# Patient Record
Sex: Male | Born: 1985 | Race: Black or African American | Hispanic: No | Marital: Single | Smoking: Current some day smoker
Health system: Southern US, Community
[De-identification: ages and names within clinical notes are randomized; demographics above are authoritative.]

---
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
## 2007-06-08 ENCOUNTER — Emergency Department (HOSPITAL_COMMUNITY): Admission: EM | Admit: 2007-06-08 | Discharge: 2007-06-08 | Payer: Self-pay | Admitting: Emergency Medicine

## 2008-06-05 IMAGING — CT CT HEAD W/O CM
1 series · 16 of 28 positions shown, 20 images · IV contrast (agent unspecified)
Comparison: None.

CLINICAL DATA: Right-sided headaches.
 HEAD CT WITHOUT CONTRAST:
TECHNIQUE: Contiguous axial images were obtained from the base of the skull through the vertex according to standard protocol without contrast.

[Series 2: brain · axial · 0.47mm/px · z∈[+140,+268]mm · 16 of 28 slices shown, 20 images]
[im 2/28  brain]
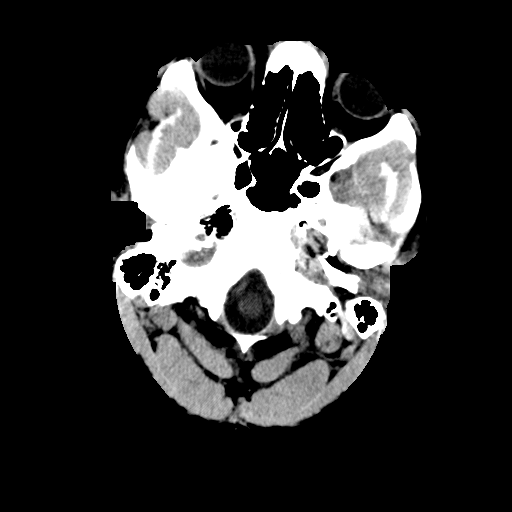
[im 2/28  bone]
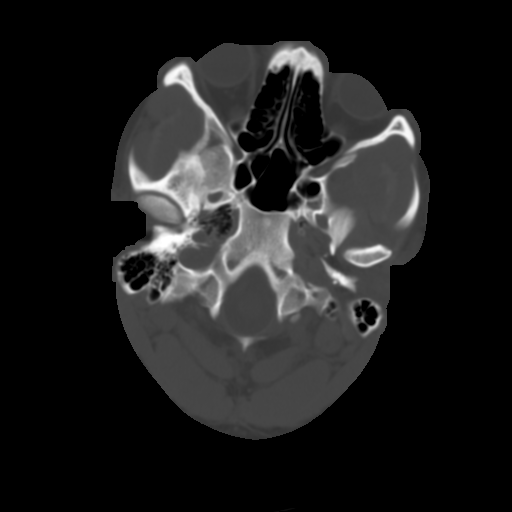
[im 4/28  brain]
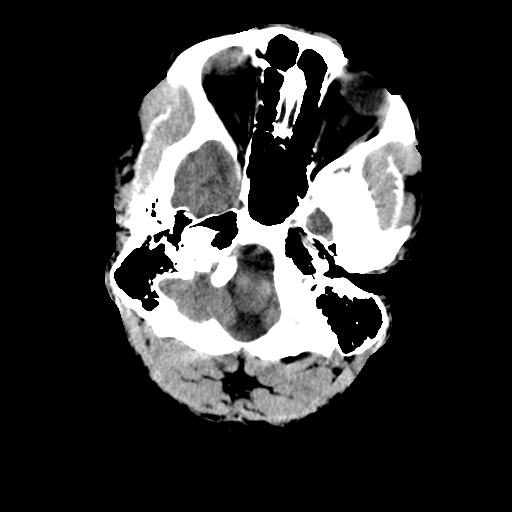
[im 6/28  brain]
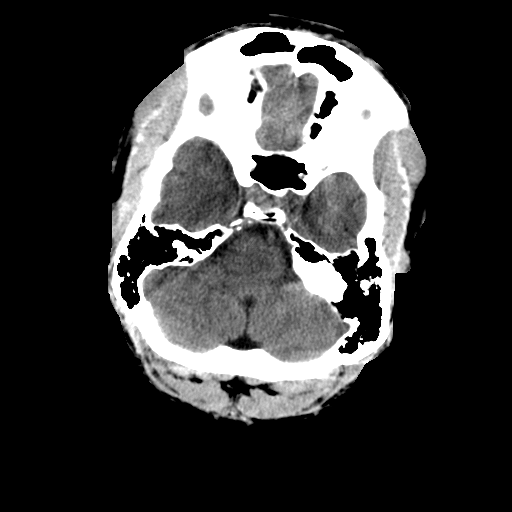
[im 7/28  brain]
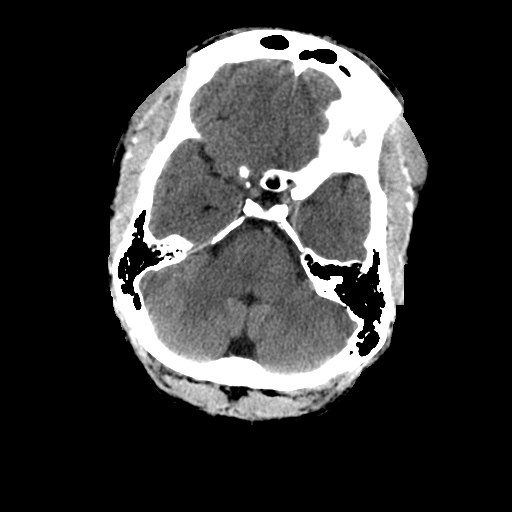
[im 9/28  brain]
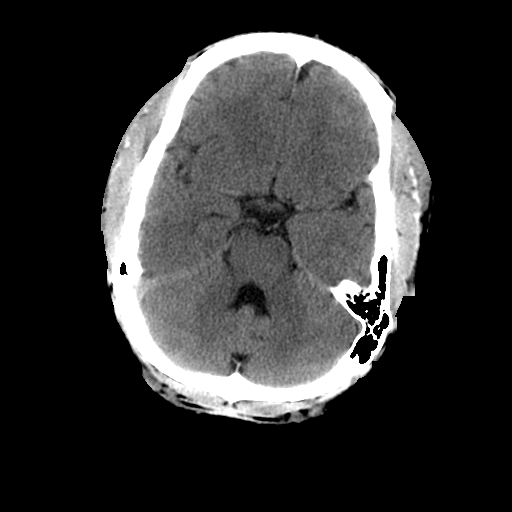
[im 9/28  bone]
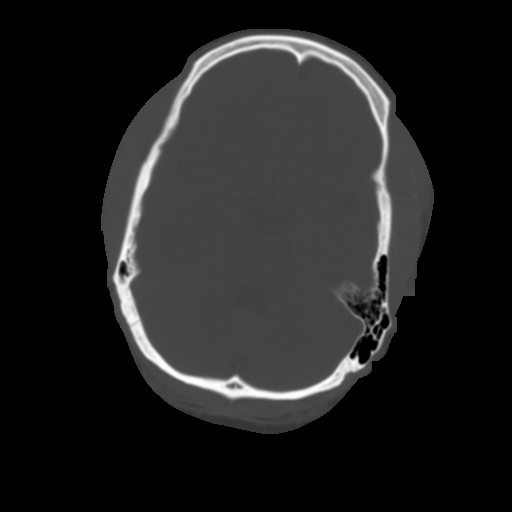
[im 10/28  brain]
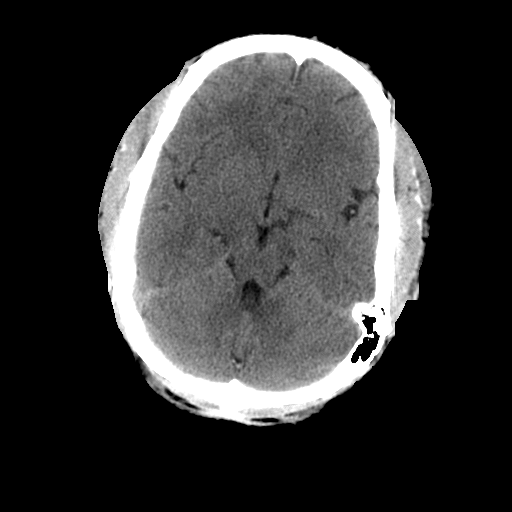
[im 12/28  brain]
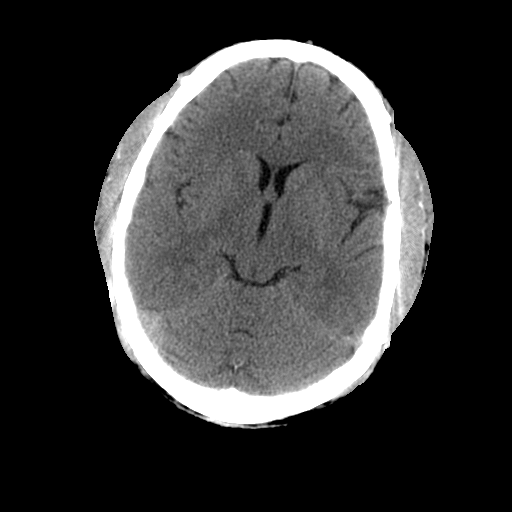
[im 14/28  brain]
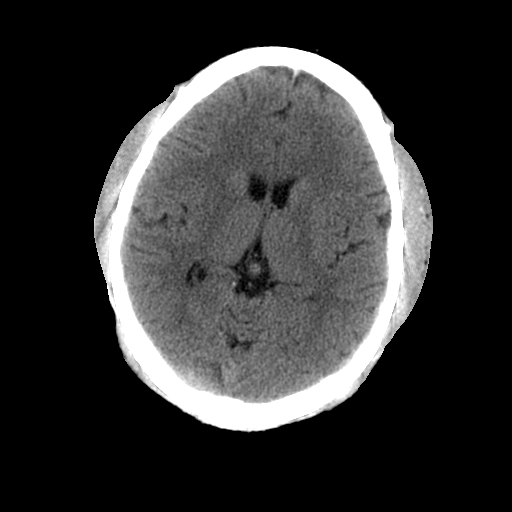
[im 15/28  brain]
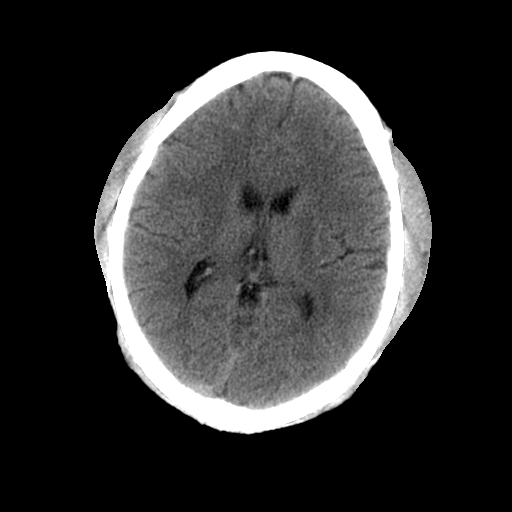
[im 15/28  bone]
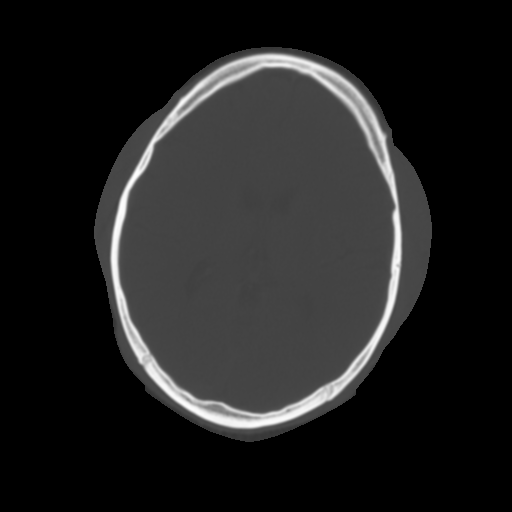
[im 17/28  brain]
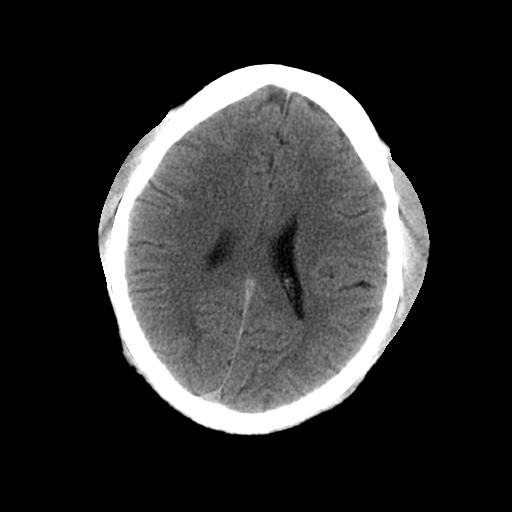
[im 19/28  brain]
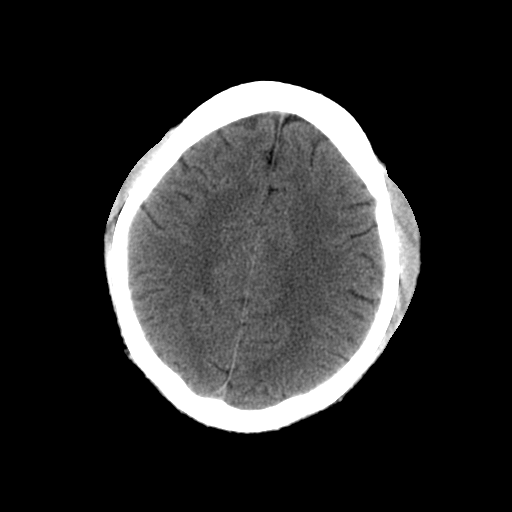
[im 20/28  brain]
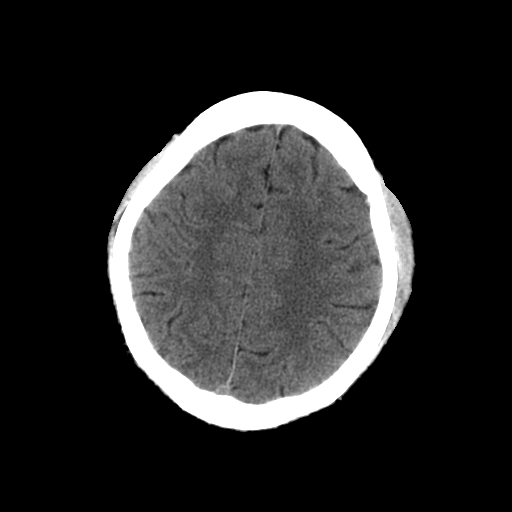
[im 22/28  brain]
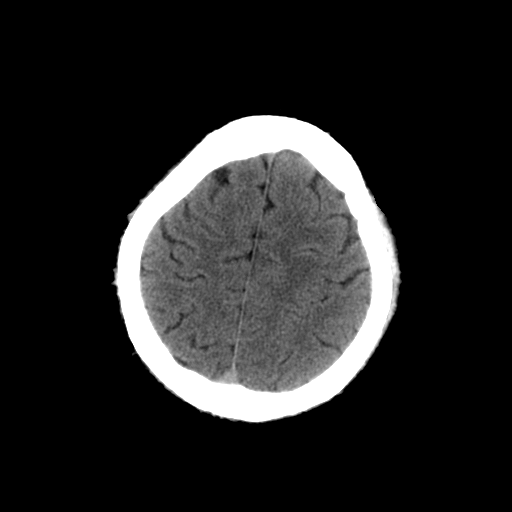
[im 22/28  bone]
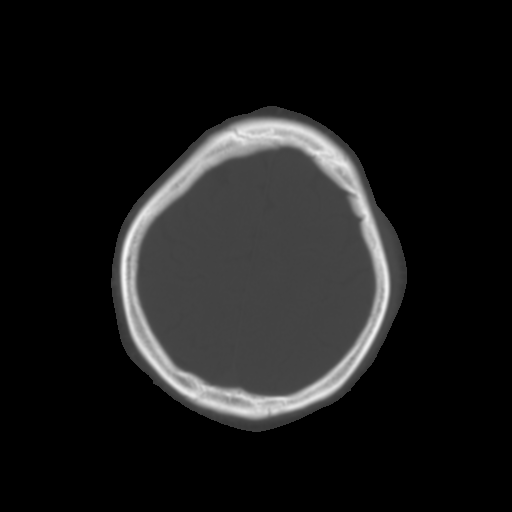
[im 23/28  brain]
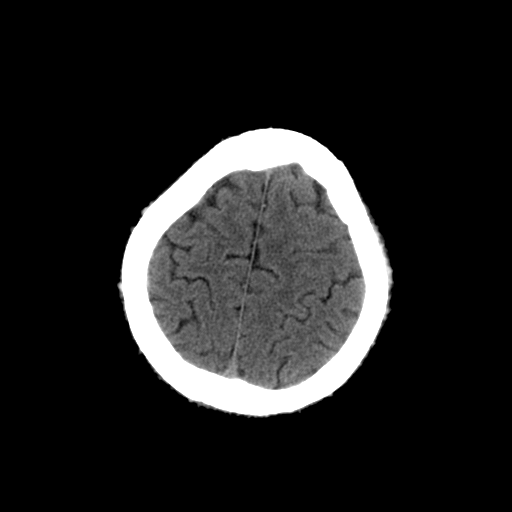
[im 25/28  brain]
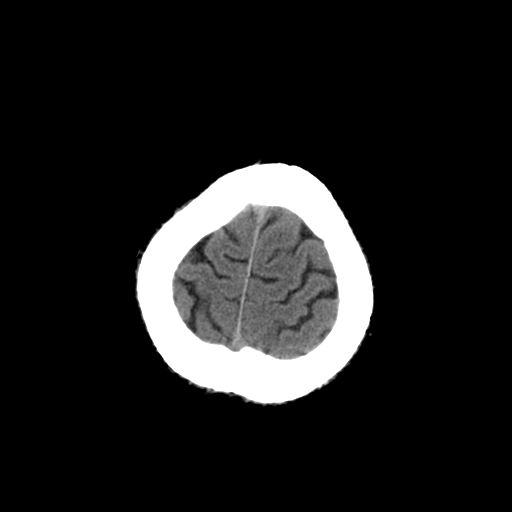
[im 27/28  brain]
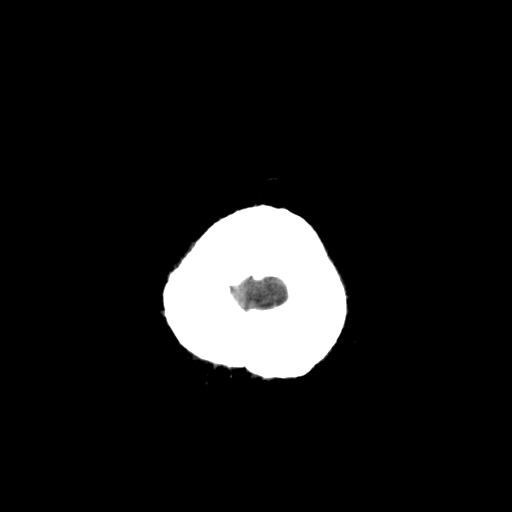

[16 of 28 positions shown; findings below may reference images not displayed]

FINDINGS: The cerebral and cerebellar parenchyma are symmetric and normal in appearance. Ventricles and basilar cisterns midline. Negative for acute intracranial hemorrhage or edema. Mastoid air cells and sinuses clear.
IMPRESSION: CT brain is negative for acute intracranial hemorrhage or edema.

## 2011-11-20 ENCOUNTER — Emergency Department (HOSPITAL_COMMUNITY)
Admission: EM | Admit: 2011-11-20 | Discharge: 2011-11-20 | Disposition: A | Discharge status: Home / Self Care | Payer: PRIVATE HEALTH INSURANCE | Attending: Emergency Medicine | Admitting: Emergency Medicine

## 2011-11-20 ENCOUNTER — Encounter (HOSPITAL_COMMUNITY): Payer: Self-pay

## 2011-11-20 ENCOUNTER — Emergency Department (HOSPITAL_COMMUNITY): Payer: PRIVATE HEALTH INSURANCE

## 2011-11-20 DIAGNOSIS — Z23 Encounter for immunization: Secondary | ICD-10-CM | POA: Insufficient documentation

## 2011-11-20 DIAGNOSIS — T07XXXA Unspecified multiple injuries, initial encounter: Secondary | ICD-10-CM

## 2011-11-20 DIAGNOSIS — F17200 Nicotine dependence, unspecified, uncomplicated: Secondary | ICD-10-CM | POA: Insufficient documentation

## 2011-11-20 DIAGNOSIS — S61412A Laceration without foreign body of left hand, initial encounter: Secondary | ICD-10-CM

## 2011-11-20 DIAGNOSIS — IMO0002 Reserved for concepts with insufficient information to code with codable children: Secondary | ICD-10-CM | POA: Insufficient documentation

## 2011-11-20 DIAGNOSIS — S61409A Unspecified open wound of unspecified hand, initial encounter: Secondary | ICD-10-CM | POA: Insufficient documentation

## 2011-11-20 MED ORDER — AMOXICILLIN-POT CLAVULANATE 875-125 MG PO TABS: 1.0000 | ORAL_TABLET | Freq: Two times a day (BID) | ORAL | Status: AC

## 2011-11-20 MED ORDER — TETANUS-DIPHTH-ACELL PERTUSSIS 5-2.5-18.5 LF-MCG/0.5 IM SUSP
0.5000 mL | Freq: Once | INTRAMUSCULAR | Status: AC
  Administered 2011-11-20: 0.5 mL via INTRAMUSCULAR
  Filled 2011-11-20: qty 0.5

## 2011-11-20 MED ORDER — HYDROCODONE-ACETAMINOPHEN 5-325 MG PO TABS: 1.0000 | ORAL_TABLET | Freq: Four times a day (QID) | ORAL | Status: AC | PRN

## 2011-11-20 NOTE — ED Notes (Signed)
Dressing applied by ems
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
## 2011-11-20 NOTE — Discharge Instructions (Signed)
Follow up with the hand surgeon as needed. Return here as needed. Keep area clean and dry.

## 2011-11-21 NOTE — ED Provider Notes (Signed)
Medical screening examination/treatment/procedure(s) were performed by non-physician practitioner and as supervising physician I was immediately available for consultation/collaboration.  Lazer Wollard, MD 11/21/11 0049 
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
## 2015-08-20 ENCOUNTER — Ambulatory Visit (INDEPENDENT_AMBULATORY_CARE_PROVIDER_SITE_OTHER): Payer: BLUE CROSS/BLUE SHIELD | Admitting: Physician Assistant

## 2015-08-20 ENCOUNTER — Encounter: Payer: Self-pay | Admitting: Physician Assistant

## 2015-08-20 VITALS — BP 130/90 | HR 106 | Temp 98.1°F | Resp 18 | Ht 72.5 in | Wt 233.0 lb

## 2015-08-20 DIAGNOSIS — R5383 Other fatigue: Secondary | ICD-10-CM | POA: Diagnosis not present

## 2015-08-20 DIAGNOSIS — R0683 Snoring: Secondary | ICD-10-CM

## 2015-08-20 DIAGNOSIS — Z13220 Encounter for screening for lipoid disorders: Secondary | ICD-10-CM

## 2015-08-20 DIAGNOSIS — Z79899 Other long term (current) drug therapy: Secondary | ICD-10-CM | POA: Diagnosis not present

## 2015-08-20 DIAGNOSIS — Z1389 Encounter for screening for other disorder: Secondary | ICD-10-CM

## 2015-08-20 DIAGNOSIS — Z0000 Encounter for general adult medical examination without abnormal findings: Secondary | ICD-10-CM

## 2015-08-20 DIAGNOSIS — I10 Essential (primary) hypertension: Secondary | ICD-10-CM

## 2015-08-20 DIAGNOSIS — R6889 Other general symptoms and signs: Secondary | ICD-10-CM | POA: Diagnosis not present

## 2015-08-20 DIAGNOSIS — Z0001 Encounter for general adult medical examination with abnormal findings: Secondary | ICD-10-CM

## 2015-08-20 DIAGNOSIS — E559 Vitamin D deficiency, unspecified: Secondary | ICD-10-CM

## 2015-08-20 DIAGNOSIS — Z113 Encounter for screening for infections with a predominantly sexual mode of transmission: Secondary | ICD-10-CM | POA: Diagnosis not present

## 2015-08-20 DIAGNOSIS — Z131 Encounter for screening for diabetes mellitus: Secondary | ICD-10-CM

## 2015-08-20 DIAGNOSIS — E669 Obesity, unspecified: Secondary | ICD-10-CM

## 2015-08-20 NOTE — Patient Instructions (Addendum)
I think it is possible that you have sleep apnea. It can cause interrupted sleep, headaches, frequent awakenings, fatigue, dry mouth, fast/slow heart beats, memory issues, anxiety/depression, swelling, numbness tingling hands/feet, weight gain, shortness of breath, and the list goes on. Sleep apnea needs to be ruled out because if it is left untreated it does eventually lead to abnormal heart beats, lung failure or heart failure as well as increasing the risk of heart attack and stroke. There are masks you can wear OR a mouth piece that I can give you information about. Often times though people feel MUCH better after getting treatment.   Sleep Apnea  Sleep apnea is a sleep disorder characterized by abnormal pauses in breathing while you sleep. When your breathing pauses, the level of oxygen in your blood decreases. This causes you to move out of deep sleep and into light sleep. As a result, your quality of sleep is poor, and the system that carries your blood throughout your body (cardiovascular system) experiences stress. If sleep apnea remains untreated, the following conditions can develop:  High blood pressure (hypertension).  Coronary artery disease.  Inability to achieve or maintain an erection (impotence).  Impairment of your thought process (cognitive dysfunction). There are three types of sleep apnea: 1. Obstructive sleep apnea--Pauses in breathing during sleep because of a blocked airway. 2. Central sleep apnea--Pauses in breathing during sleep because the area of the brain that controls your breathing does not send the correct signals to the muscles that control breathing. 3. Mixed sleep apnea--A combination of both obstructive and central sleep apnea.  RISK FACTORS The following risk factors can increase your risk of developing sleep apnea:  Being overweight.  Smoking.  Having narrow passages in your nose and throat.  Being of older age.  Being male.  Alcohol use.   Sedative and tranquilizer use.  Ethnicity. Among individuals younger than 35 years, African Americans are at increased risk of sleep apnea. SYMPTOMS   Difficulty staying asleep.  Daytime sleepiness and fatigue.  Loss of energy.  Irritability.  Loud, heavy snoring.  Morning headaches.  Trouble concentrating.  Forgetfulness.  Decreased interest in sex. DIAGNOSIS  In order to diagnose sleep apnea, your caregiver will perform a physical examination. Your caregiver may suggest that you take a home sleep test. Your caregiver may also recommend that you spend the night in a sleep lab. In the sleep lab, several monitors record information about your heart, lungs, and brain while you sleep. Your leg and arm movements and blood oxygen level are also recorded. TREATMENT The following actions may help to resolve mild sleep apnea:  Sleeping on your side.   Using a decongestant if you have nasal congestion.   Avoiding the use of depressants, including alcohol, sedatives, and narcotics.   Losing weight and modifying your diet if you are overweight. There also are devices and treatments to help open your airway:  Oral appliances. These are custom-made mouthpieces that shift your lower jaw forward and slightly open your bite. This opens your airway.  Devices that create positive airway pressure. This positive pressure "splints" your airway open to help you breathe better during sleep. The following devices create positive airway pressure:  Continuous positive airway pressure (CPAP) device. The CPAP device creates a continuous level of air pressure with an air pump. The air is delivered to your airway through a mask while you sleep. This continuous pressure keeps your airway open.  Nasal expiratory positive airway pressure (EPAP) device. The EPAP device  creates positive air pressure as you exhale. The device consists of single-use valves, which are inserted into each nostril and held in  place by adhesive. The valves create very little resistance when you inhale but create much more resistance when you exhale. That increased resistance creates the positive airway pressure. This positive pressure while you exhale keeps your airway open, making it easier to breath when you inhale again.  Bilevel positive airway pressure (BPAP) device. The BPAP device is used mainly in patients with central sleep apnea. This device is similar to the CPAP device because it also uses an air pump to deliver continuous air pressure through a mask. However, with the BPAP machine, the pressure is set at two different levels. The pressure when you exhale is lower than the pressure when you inhale.  Surgery. Typically, surgery is only done if you cannot comply with less invasive treatments or if the less invasive treatments do not improve your condition. Surgery involves removing excess tissue in your airway to create a wider passage way. Document Released: 05/21/2002 Document Revised: 09/25/2012 Document Reviewed: 10/07/2011 [facility] Patient Information 2015 [place], [place]. This information is not intended to replace advice given to you by your health care provider. Make sure you discuss any questions you have with your health care provider.    Preventive Care for Adults A healthy lifestyle and preventive care can promote health and wellness. Preventive health guidelines for men include the following key practices: 1. A routine yearly physical is a good way to check with your health care provider about your health and preventative screening. It is a chance to share any concerns and updates on your health and to receive a thorough exam. 2. Visit your dentist for a routine exam and preventative care every 6 months. Brush your teeth twice a day and floss once a day. Good oral hygiene prevents tooth decay and gum disease. 3. The frequency of eye exams is based on your age, health, family medical history, use of  contact lenses, and other factors. Follow your health care provider's recommendations for frequency of eye exams. 4. Eat a healthy diet. Foods such as vegetables, fruits, whole grains, low-fat dairy products, and lean protein foods contain the nutrients you need without too many calories. Decrease your intake of foods high in solid fats, added sugars, and salt. Eat the right amount of calories for you.Get information about a proper diet from your health care provider, if necessary. 5. Regular physical exercise is one of the most important things you can do for your health. Most adults should get at least 150 minutes of moderate-intensity exercise (any activity that increases your heart rate and causes you to sweat) each week. In addition, most adults need muscle-strengthening exercises on 2 or more days a week. 6. Maintain a healthy weight. The body mass index (BMI) is a screening tool to identify possible weight problems. It provides an estimate of body fat based on height and weight. Your health care provider can find your BMI and can help you achieve or maintain a healthy weight.For adults 20 years and older: 1. A BMI below 18.5 is considered underweight. 2. A BMI of 18.5 to 24.9 is normal. 3. A BMI of 25 to 29.9 is considered overweight. 4. A BMI of 30 and above is considered obese. 7. Maintain normal blood lipids and cholesterol levels by exercising and minimizing your intake of saturated fat. Eat a balanced diet with plenty of fruit and vegetables. Blood tests for lipids and cholesterol  should begin at age 5 and be repeated every 5 years. If your lipid or cholesterol levels are high, you are over 50, or you are at high risk for heart disease, you may need your cholesterol levels checked more frequently.Ongoing high lipid and cholesterol levels should be treated with medicines if diet and exercise are not working. 8. If you smoke, find out from your health care provider how to quit. If you do not  use tobacco, do not start. 9. Lung cancer screening is recommended for adults aged 60-80 years who are at high risk for developing lung cancer because of a history of smoking. A yearly low-dose CT scan of the lungs is recommended for people who have at least a 30-pack-year history of smoking and are a current smoker or have quit within the past 15 years. A pack year of smoking is smoking an average of 1 pack of cigarettes a day for 1 year (for example: 1 pack a day for 30 years or 2 packs a day for 15 years). Yearly screening should continue until the smoker has stopped smoking for at least 15 years. Yearly screening should be stopped for people who develop a health problem that would prevent them from having lung cancer treatment. 10. If you choose to drink alcohol, do not have more than 2 drinks per day. One drink is considered to be 12 ounces (355 mL) of beer, 5 ounces (148 mL) of wine, or 1.5 ounces (44 mL) of liquor. 11. High blood pressure causes heart disease and increases the risk of stroke. Your blood pressure should be checked. Ongoing high blood pressure should be treated with medicines, if weight loss and exercise are not effective. 38. If you are 40-62 years old, ask your health care provider if you should take aspirin to prevent heart disease. 13. Diabetes screening involves taking a blood sample to check your fasting blood sugar level. Testing should be considered at a younger age or be carried out more frequently if you are overweight and have at least 1 risk factor for diabetes. 14. Colorectal cancer can be detected and often prevented. Most routine colorectal cancer screening begins at the age of 17 and continues through age 38. However, your health care provider may recommend screening at an earlier age if you have risk factors for colon cancer. On a yearly basis, your health care provider may provide home test kits to check for hidden blood in the stool. Use of a small camera at the end of  a tube to directly examine the colon (sigmoidoscopy or colonoscopy) can detect the earliest forms of colorectal cancer. Talk to your health care provider about this at age 34, when routine screening begins. Direct exam of the colon should be repeated every 5-10 years through age 11, unless early forms of precancerous polyps or small growths are found. 48. Screening for abdominal aortic aneurysm (AAA)  are recommended for persons over age 67 who have history of hypertensionor who are current or former smokers. 81. Talk with your health care provider about prostate cancer screening. 82. Testicular cancer screening is recommended for adult males. Screening includes self-exam, a health care provider exam, and other screening tests. Consult with your health care provider about any symptoms you have or any concerns you have about testicular cancer. 18. Use sunscreen. Apply sunscreen liberally and repeatedly throughout the day. You should seek shade when your shadow is shorter than you. Protect yourself by wearing long sleeves, pants, a wide-brimmed hat, and sunglasses year  round, whenever you are outdoors. 19. Once a month, do a whole-body skin exam, using a mirror to look at the skin on your back. Tell your health care provider about new moles, moles that have irregular borders, moles that are larger than a pencil eraser, or moles that have changed in shape or color. 20. Stay current with required vaccines (immunizations). 1. Influenza vaccine. All adults should be immunized every year. 2. Tetanus, diphtheria, and acellular pertussis (Td, Tdap) vaccine. An adult who has not previously received Tdap or who does not know his vaccine status should receive 1 dose of Tdap. This initial dose should be followed by tetanus and diphtheria toxoids (Td) booster doses every 10 years. Adults with an unknown or incomplete history of completing a 3-dose immunization series with Td-containing vaccines should begin or complete a  primary immunization series including a Tdap dose. Adults should receive a Td booster every 10 years. 3. Zoster vaccine. One dose is recommended for adults aged 84 years or older unless certain conditions are present. 4.  5. Pneumococcal 13-valent conjugate (PCV13) vaccine. When indicated, a person who is uncertain of his immunization history and has no record of immunization should receive the PCV13 vaccine. An adult aged 29 years or older who has certain medical conditions and has not been previously immunized should receive 1 dose of PCV13 vaccine. This PCV13 should be followed with a dose of pneumococcal polysaccharide (PPSV23) vaccine. The PPSV23 vaccine dose should be obtained at least 8 weeks after the dose of PCV13 vaccine. An adult aged 16 years or older who has certain medical conditions and previously received 1 or more doses of PPSV23 vaccine should receive 1 dose of PCV13. The PCV13 vaccine dose should be obtained 1 or more years after the last PPSV23 vaccine dose. 6.  7. Pneumococcal polysaccharide (PPSV23) vaccine. When PCV13 is also indicated, PCV13 should be obtained first. All adults aged 23 years and older should be immunized. An adult younger than age 58 years who has certain medical conditions should be immunized. Any person who resides in a nursing home or long-term care facility should be immunized. An adult smoker should be immunized. People with an immunocompromised condition and certain other conditions should receive both PCV13 and PPSV23 vaccines. People with human immunodeficiency virus (HIV) infection should be immunized as soon as possible after diagnosis. Immunization during chemotherapy or radiation therapy should be avoided. Routine use of PPSV23 vaccine is not recommended for American Indians, [place] Natives, or people younger than 65 years unless there are medical conditions that require PPSV23 vaccine. When indicated, people who have unknown immunization and have no record  of immunization should receive PPSV23 vaccine. One-time revaccination 5 years after the first dose of PPSV23 is recommended for people aged 19-64 years who have chronic kidney failure, nephrotic syndrome, asplenia, or immunocompromised conditions. People who received 1-2 doses of PPSV23 before age 60 years should receive another dose of PPSV23 vaccine at age 17 years or later if at least 5 years have passed since the previous dose. Doses of PPSV23 are not needed for people immunized with PPSV23 at or after age 53 years. 8. Hepatitis A vaccine. Adults who wish to be protected from this disease, have certain high-risk conditions, work with hepatitis A-infected animals, work in hepatitis A research labs, or travel to or work in countries with a high rate of hepatitis A should be immunized. Adults who were previously unvaccinated and who anticipate close contact with an international adoptee during the first 65  days after arrival in the [place] from a country with a high rate of hepatitis A should be immunized. 9. Hepatitis B vaccine. Adults should be immunized if they wish to be protected from this disease, have certain high-risk conditions, may be exposed to blood or other infectious body fluids, are household contacts or sex partners of hepatitis B positive people, are clients or workers in certain care facilities, or travel to or work in countries with a high rate of hepatitis B.  Preventive Service / Frequency  Ages 25 to 33 1. Blood pressure check. 2. Lipid and cholesterol check. 3. Hepatitis C blood test.** / For any individual with known risks for hepatitis C. 4. Skin self-exam. / Monthly. 5. Influenza vaccine. / Every year. 6. Tetanus, diphtheria, and acellular pertussis (Tdap, Td) vaccine.** / Consult your health care provider. 1 dose of Td every 10 years. 7. HPV vaccine. / 3 doses over 6 months, if 26 or younger. 8. Measles, mumps, rubella (MMR) vaccine.** / You need at least 1 dose of MMR  if you were born in 1957 or later. You may also need a second dose. 9. Pneumococcal 13-valent conjugate (PCV13) vaccine.** / Consult your health care provider. 10. Pneumococcal polysaccharide (PPSV23) vaccine.** / 1 to 2 doses if you smoke cigarettes or if you have certain conditions. 11. Meningococcal vaccine.** / 1 dose if you are age 64 to 54 years and a Orthoptist living in a residence hall, or have one of several medical conditions. You may also need additional booster doses. 12. Hepatitis A vaccine.** / Consult your health care provider. 13. Hepatitis B vaccine.** / Consult your health care provider.  Keeping you healthy  Chlamydia, HIV, and other sexual transmitted disease- Get screened each year until the age of 51 then within three months of each new sexual partner.  Don't smoke- If you do smoke, ask your healthcare provider about quitting. For tips on how to quit, go to www.smokefree.gov or call 1-800-QUIT-NOW.  Be physically active- Exercise 5 days a week for at least 30 minutes.  If you are not already physically active start slow and gradually work up to 30 minutes of moderate physical activity.  Examples of moderate activity include walking briskly, mowing the yard, dancing, swimming bicycling, etc.  Eat a healthy diet- Eat a variety of healthy foods such as fruits, vegetables, low fat milk, low fat cheese, yogurt, lean meats, poultry, fish, beans, tofu, etc.  For more information on healthy eating, go to www.thenutritionsource.org  Drink alcohol in moderation- Limit alcohol intake two drinks or less a day.  Never drink and drive.  Dentist- Brush and floss teeth twice daily; visit your dentis twice a year.  Depression-Your emotional health is as important as your physical health.  If you're feeling down, losing interest in things you normally enjoy please talk with your healthcare provider.  Gun Safety- If you keep a gun in your home, keep it unloaded and with the  safety lock on.  Bullets should be stored separately.  Helmet use- Always wear a helmet when riding a motorcycle, bicycle, rollerblading or skateboarding.  Safe sex- If you may be exposed to a sexually transmitted infection, use a condom  Seat belts- Seat bels can save your life; always wear one.  Smoke/Carbon Monoxide detectors- These detectors need to be installed on the appropriate level of your home.  Replace batteries at least once a year.  Skin Cancer- When out in the sun, cover up and use sunscreen SPF  15 or higher.  Violence- If anyone is threatening or hurting you, please tell your healthcare provider.

## 2015-08-20 NOTE — Progress Notes (Signed)
Complete Physical  Assessment and Plan: 1. Other fatigue - TSH - Iron and TIBC - Ferritin - Vitamin B12 - EKG 12-Lead  2. Snoring Declines sleep study at this time, info given  3. Obesity - TSH - EKG 12-Lead  4. Vitamin D deficiency - VITAMIN D 25 Hydroxy (Vit-D Deficiency, Fractures)  5. Medication management - CBC with Differential/Platelet - BASIC METABOLIC PANEL WITH GFR - Hepatic function panel - Magnesium  6. Screen for STD (sexually transmitted disease) - GC/chlamydia probe amp, urine - RPR - HIV antibody - HSV(herpes simplex vrs) 1+2 ab-IgG  7. Screening for blood or protein in urine - Urinalysis, Routine w reflex microscopic (not at [facility]) - Microalbumin / creatinine urine ratio  8. Screening for diabetes mellitus - Hemoglobin A1c - Insulin, fasting  9. Screening cholesterol level - Lipid panel  10. Routine general medical examination at a health care facility Health Maintenance- Discussed STD testing, safe sex, alcohol and drug awareness, drinking and driving dangers, wearing a seat belt and general safety measures for young adult. - CBC with Differential/Platelet - BASIC METABOLIC PANEL WITH GFR - Hepatic function panel - TSH - Lipid panel - Hemoglobin A1c - Insulin, fasting - Magnesium - VITAMIN D 25 Hydroxy (Vit-D Deficiency, Fractures) - Urinalysis, Routine w reflex microscopic (not at [facility]) - Microalbumin / creatinine urine ratio - Iron and TIBC - Ferritin - Vitamin B12 - EKG 12-Lead - GC/chlamydia probe amp, urine - RPR - HIV antibody - HSV(herpes simplex vrs) 1+2 ab-IgG  . Discussed med's effects and SE's. Screening labs and tests as requested with regular follow-up as recommended. Over 40 minutes of exam, counseling, chart review and critical decision making was performed   HPI  This very nice 30 y.o.male presents for complete physical.  Patient has no major health issues.  Patient reports no complaints at this time. Delores  guidotti is his aunt. He is from [place], works in Aeronautical engineer.  He does not workout but new job is physical.  Has been with GF x 3 years, has step daughter that is 6. Has had 5 partners, is sexually active, never STD testing.  Finally, patient has history of Vitamin D Deficiency .  + fatigue, snoring.  Has some depression some times but states in reaction to things like his cousin juma dying from cancer.  BMI is Body mass index is 31.15 kg/(m^2)., he is working on diet and exercise. Wt Readings from Last 3 Encounters:  08/20/15 233 lb (105.688 kg)  11/20/11 220 lb (99.791 kg)     Current Medications:  No current outpatient prescriptions on file prior to visit.   No current facility-administered medications on file prior to visit.   Health Maintenance:   Immunization History  Administered Date(s) Administered  . Tdap 11/20/2011    TD/TDAP: 2013.    Sexually Active: yes STD testing offered   Allergies: No Known Allergies Medical History: No past medical history on file. Surgical History: No past surgical history on file. Family History: No family history on file. Social History:  Social History  Substance Use Topics  . Smoking status: Current Some Day Smoker -- 15 years    Types: Cigarettes  . Smokeless tobacco: Never Used  . Alcohol Use: Yes     Comment: weekends only, very rare    Review of Systems: Review of Systems  Constitutional: Positive for malaise/fatigue. Negative for fever, chills, weight loss and diaphoresis.  HENT: Negative.   Eyes: Negative.   Respiratory: Negative.   Cardiovascular: Negative.  Gastrointestinal: Negative.   Genitourinary: Negative.   Musculoskeletal: Negative.   Skin: Negative.   Neurological: Negative.  Negative for weakness.  Endo/Heme/Allergies: Negative.   Psychiatric/Behavioral: Negative.     Physical Exam: Estimated body mass index is 31.15 kg/(m^2) as calculated from the following:   Height as of this encounter: 6' 0.5"  (1.842 m).   Weight as of this encounter: 233 lb (105.688 kg). BP 130/90 mmHg  Pulse 106  Temp(Src) 98.1 F (36.7 C) (Temporal)  Resp 18  Ht 6' 0.5" (1.842 m)  Wt 233 lb (105.688 kg)  BMI 31.15 kg/m2  SpO2 98% General Appearance: Well nourished, in no apparent distress.  Eyes: PERRLA, EOMs, conjunctiva no swelling or erythema, normal fundi and vessels.  Sinuses: No Frontal/maxillary tenderness  ENT/Mouth: Ext aud canals clear, normal light reflex with TMs without erythema, bulging. Good dentition. No erythema, swelling, or exudate on post pharynx. Tonsils not swollen or erythematous. Hearing normal.  Neck: Supple, thyroid normal. No bruits  Respiratory: Respiratory effort normal, BS equal bilaterally without rales, rhonchi, wheezing or stridor.  Cardio: RRR without murmurs, rubs or gallops. Brisk peripheral pulses without edema.  Chest: symmetric, with normal excursions and percussion.  Abdomen: Soft, nontender, no guarding, rebound, hernias, masses, or organomegaly.  Lymphatics: Non tender without lymphadenopathy.  Genitourinary: defer Musculoskeletal: Full ROM all peripheral extremities,5/5 strength, and normal gait.  Skin: Warm, dry without rashes, lesions, ecchymosis. Neuro: Cranial nerves intact, reflexes equal bilaterally. Normal muscle tone, no cerebellar symptoms. Sensation intact.  Psych: Awake and oriented X 3, normal affect, Insight and Judgment appropriate.   EKG: WNL, no ST changes  Quentin MullingAmanda Daniele Dillow 12:18 PM [facility]

## 2015-08-21 LAB — CBC WITH DIFFERENTIAL/PLATELET
Basophils Absolute: 0 10*3/uL (ref 0.0–0.1)
Basophils Relative: 0 % (ref 0–1)
Eosinophils Absolute: 0.1 10*3/uL (ref 0.0–0.7)
Eosinophils Relative: 1 % (ref 0–5)
HCT: 42.8 % (ref 39.0–52.0)
Hemoglobin: 14 g/dL (ref 13.0–17.0)
Lymphocytes Relative: 35 % (ref 12–46)
Lymphs Abs: 2.4 10*3/uL (ref 0.7–4.0)
MCH: 31.1 pg (ref 26.0–34.0)
MCHC: 32.7 g/dL (ref 30.0–36.0)
MCV: 95.1 fL (ref 78.0–100.0)
MPV: 11.7 fL (ref 8.6–12.4)
Monocytes Absolute: 0.4 10*3/uL (ref 0.1–1.0)
Monocytes Relative: 6 % (ref 3–12)
Neutro Abs: 3.9 10*3/uL (ref 1.7–7.7)
Neutrophils Relative %: 58 % (ref 43–77)
Platelets: 291 10*3/uL (ref 150–400)
RBC: 4.5 MIL/uL (ref 4.22–5.81)
RDW: 13.4 % (ref 11.5–15.5)
WBC: 6.8 10*3/uL (ref 4.0–10.5)

## 2015-08-21 LAB — IRON AND TIBC
%SAT: 20 % (ref 15–60)
Iron: 53 ug/dL (ref 50–180)
TIBC: 268 ug/dL (ref 250–425)
UIBC: 215 ug/dL (ref 125–400)

## 2015-08-21 LAB — HEPATIC FUNCTION PANEL
ALT: 29 U/L (ref 9–46)
AST: 16 U/L (ref 10–40)
Albumin: 4.1 g/dL (ref 3.6–5.1)
Alkaline Phosphatase: 73 U/L (ref 40–115)
Bilirubin, Direct: 0.1 mg/dL (ref <=0.2)
Indirect Bilirubin: 0.2 mg/dL (ref 0.2–1.2)
Total Bilirubin: 0.3 mg/dL (ref 0.2–1.2)
Total Protein: 7.1 g/dL (ref 6.1–8.1)

## 2015-08-21 LAB — URINALYSIS, ROUTINE W REFLEX MICROSCOPIC
Bilirubin Urine: NEGATIVE
Glucose, UA: NEGATIVE
Hgb urine dipstick: NEGATIVE
Ketones, ur: NEGATIVE
Leukocytes, UA: NEGATIVE
Nitrite: NEGATIVE
Protein, ur: NEGATIVE
Specific Gravity, Urine: 1.018 (ref 1.001–1.035)
pH: 6 (ref 5.0–8.0)

## 2015-08-21 LAB — BASIC METABOLIC PANEL WITH GFR
BUN: 10 mg/dL (ref 7–25)
CO2: 25 mmol/L (ref 20–31)
Calcium: 9.5 mg/dL (ref 8.6–10.3)
Chloride: 106 mmol/L (ref 98–110)
Creat: 0.94 mg/dL (ref 0.60–1.35)
GFR, Est African American: >89 mL/min (ref >=60)
GFR, Est Non African American: >89 mL/min (ref >=60)
Glucose, Bld: 88 mg/dL (ref 65–99)
Potassium: 4.3 mmol/L (ref 3.5–5.3)
Sodium: 139 mmol/L (ref 135–146)

## 2015-08-21 LAB — VITAMIN D 25 HYDROXY (VIT D DEFICIENCY, FRACTURES): Vit D, 25-Hydroxy: 9 ng/mL — ABNORMAL LOW (ref 30–100)

## 2015-08-21 LAB — MAGNESIUM: Magnesium: 1.8 mg/dL (ref 1.5–2.5)

## 2015-08-21 LAB — FERRITIN: Ferritin: 157 ng/mL (ref 20–345)

## 2015-08-21 LAB — RPR

## 2015-08-21 LAB — LIPID PANEL
Cholesterol: 166 mg/dL (ref 125–200)
HDL: 39 mg/dL — ABNORMAL LOW (ref >=40)
LDL Cholesterol: 95 mg/dL (ref <130)
Total CHOL/HDL Ratio: 4.3 Ratio (ref <=5.0)
Triglycerides: 162 mg/dL — ABNORMAL HIGH (ref <150)
VLDL: 32 mg/dL — ABNORMAL HIGH (ref <30)

## 2015-08-21 LAB — INSULIN, FASTING: Insulin fasting, serum: 12.5 u[IU]/mL (ref 2.0–19.6)

## 2015-08-21 LAB — MICROALBUMIN / CREATININE URINE RATIO
Creatinine, Urine: 142 mg/dL (ref 20–370)
Microalb Creat Ratio: 1 mcg/mg creat (ref <30)
Microalb, Ur: 0.2 mg/dL

## 2015-08-21 LAB — GC/CHLAMYDIA PROBE AMP
CT Probe RNA: NOT DETECTED
GC Probe RNA: NOT DETECTED

## 2015-08-21 LAB — TSH: TSH: 1.23 mIU/L (ref 0.40–4.50)

## 2015-08-21 LAB — HSV(HERPES SIMPLEX VRS) I + II AB-IGG
HSV 1 Glycoprotein G Ab, IgG: 37.9 Index — ABNORMAL HIGH (ref <0.90)
HSV 2 Glycoprotein G Ab, IgG: 1.83 Index — ABNORMAL HIGH (ref <0.90)

## 2015-08-21 LAB — HEMOGLOBIN A1C
Hgb A1c MFr Bld: 5.6 % (ref <5.7)
Mean Plasma Glucose: 114 mg/dL (ref <117)

## 2015-08-21 LAB — VITAMIN B12: Vitamin B-12: 670 pg/mL (ref 200–1100)

## 2015-08-21 LAB — HIV ANTIBODY (ROUTINE TESTING W REFLEX): HIV 1&2 Ab, 4th Generation: NONREACTIVE

## 2015-08-26 ENCOUNTER — Other Ambulatory Visit: Payer: Self-pay | Admitting: Physician Assistant

## 2015-08-26 MED ORDER — VALACYCLOVIR HCL 500 MG PO TABS: 500.0000 mg | ORAL_TABLET | Freq: Every day | ORAL | Status: AC
